# Patient Record
Sex: Female | Born: 1985 | Hispanic: Yes | Marital: Married
Health system: Southern US, Community
[De-identification: ages and names within clinical notes are randomized; demographics above are authoritative.]

## PROBLEM LIST (undated history)

## (undated) DIAGNOSIS — E119 Type 2 diabetes mellitus without complications: Secondary | ICD-10-CM

---

## 2020-02-28 ENCOUNTER — Other Ambulatory Visit: Payer: Self-pay

## 2020-02-28 DIAGNOSIS — E119 Type 2 diabetes mellitus without complications: Secondary | ICD-10-CM | POA: Insufficient documentation

## 2020-02-28 DIAGNOSIS — O2391 Unspecified genitourinary tract infection in pregnancy, first trimester: Secondary | ICD-10-CM | POA: Insufficient documentation

## 2020-02-28 DIAGNOSIS — O4691 Antepartum hemorrhage, unspecified, first trimester: Secondary | ICD-10-CM | POA: Insufficient documentation

## 2020-02-28 DIAGNOSIS — Z3A11 11 weeks gestation of pregnancy: Secondary | ICD-10-CM | POA: Insufficient documentation

## 2020-02-29 ENCOUNTER — Emergency Department (HOSPITAL_COMMUNITY)
Admission: EM | Admit: 2020-02-29 | Discharge: 2020-02-29 | Disposition: A | Discharge status: Home / Self Care | Payer: Self-pay | Attending: Emergency Medicine | Admitting: Emergency Medicine

## 2020-02-29 ENCOUNTER — Emergency Department (HOSPITAL_COMMUNITY): Payer: Self-pay

## 2020-02-29 ENCOUNTER — Encounter (HOSPITAL_COMMUNITY): Payer: Self-pay

## 2020-02-29 DIAGNOSIS — R8271 Bacteriuria: Secondary | ICD-10-CM

## 2020-02-29 DIAGNOSIS — R58 Hemorrhage, not elsewhere classified: Secondary | ICD-10-CM

## 2020-02-29 DIAGNOSIS — O418X10 Other specified disorders of amniotic fluid and membranes, first trimester, not applicable or unspecified: Secondary | ICD-10-CM

## 2020-02-29 DIAGNOSIS — O468X1 Other antepartum hemorrhage, first trimester: Secondary | ICD-10-CM

## 2020-02-29 DIAGNOSIS — O4690 Antepartum hemorrhage, unspecified, unspecified trimester: Secondary | ICD-10-CM

## 2020-02-29 HISTORY — DX: Type 2 diabetes mellitus without complications: E11.9

## 2020-02-29 LAB — URINALYSIS, ROUTINE W REFLEX MICROSCOPIC
Bilirubin Urine: NEGATIVE
Glucose, UA: NEGATIVE mg/dL
Ketones, ur: NEGATIVE mg/dL
Nitrite: NEGATIVE
Protein, ur: NEGATIVE mg/dL
Specific Gravity, Urine: 1.006 (ref 1.005–1.030)
pH: 6 (ref 5.0–8.0)

## 2020-02-29 LAB — CBC
HCT: 38 % (ref 36.0–46.0)
Hemoglobin: 12 g/dL (ref 12.0–15.0)
MCH: 27.8 pg (ref 26.0–34.0)
MCHC: 31.6 g/dL (ref 30.0–36.0)
MCV: 88.2 fL (ref 80.0–100.0)
Platelets: 314 10*3/uL (ref 150–400)
RBC: 4.31 MIL/uL (ref 3.87–5.11)
RDW: 12.9 % (ref 11.5–15.5)
WBC: 11.4 10*3/uL — ABNORMAL HIGH (ref 4.0–10.5)
nRBC: 0 % (ref 0.0–0.2)

## 2020-02-29 LAB — WET PREP, GENITAL
Sperm: NONE SEEN
Trich, Wet Prep: NONE SEEN
WBC, Wet Prep HPF POC: NONE SEEN
Yeast Wet Prep HPF POC: NONE SEEN

## 2020-02-29 LAB — COMPREHENSIVE METABOLIC PANEL
ALT: 18 U/L (ref 0–44)
AST: 22 U/L (ref 15–41)
Albumin: 3.8 g/dL (ref 3.5–5.0)
Alkaline Phosphatase: 41 U/L (ref 38–126)
Anion gap: 10 (ref 5–15)
BUN: 10 mg/dL (ref 6–20)
CO2: 22 mmol/L (ref 22–32)
Calcium: 9.1 mg/dL (ref 8.9–10.3)
Chloride: 105 mmol/L (ref 98–111)
Creatinine, Ser: 0.57 mg/dL (ref 0.44–1.00)
GFR, Estimated: >60 mL/min (ref >60)
Glucose, Bld: 89 mg/dL (ref 70–99)
Potassium: 4.1 mmol/L (ref 3.5–5.1)
Sodium: 137 mmol/L (ref 135–145)
Total Bilirubin: 0.3 mg/dL (ref 0.3–1.2)
Total Protein: 7.4 g/dL (ref 6.5–8.1)

## 2020-02-29 LAB — ABO/RH: ABO/RH(D): O POS

## 2020-02-29 LAB — HCG, QUANTITATIVE, PREGNANCY: hCG, Beta Chain, Quant, S: 96134 m[IU]/mL — ABNORMAL HIGH (ref <5)

## 2020-02-29 MED ORDER — CEPHALEXIN 500 MG PO CAPS: 500.0000 mg | ORAL_CAPSULE | Freq: Four times a day (QID) | ORAL | 0 refills | Status: AC

## 2020-02-29 NOTE — ED Provider Notes (Signed)
[facility] Note   CSN: [id number] Arrival date & time: 02/28/20  2355     History Chief Complaint  Patient presents with  . Vaginal Bleeding    Erica Garrison is a 35 y.o. female with PMH/o DM who presents for evaluation of vaginal bleeding. Patient reports she is currently [redacted] weeks pregnant by LMP (12/14/19) who is G6P5 who presents for evaluation for vaginal bleeding and abdominal cramping that has been ongoing for the last 4 days. Patient reports that she has not sought OB/GYN care for this baby yet but has an appointment scheduled for February.  4 days ago, she started noticing some lower abdominal cramping and then started having some vaginal bleeding.  States that the abdominal cramping is intermittent in nature and states that currently, she does not have any pain.  She states it started off light pink and is since become more dark red/brown in color.  She states that when she does the bathroom and urinates, she will pass blood clots.  She states she has had small spotting at other times but is not grossly vaginal bleeding.  Has not had to use pads throughout the day.  She states since being here, she has only had a few episodes of spotting.  She states that she has not had any prior complications with her pregnancy.  He denies any fevers, nausea/vomiting, difficulty breathing.  The history is provided by the patient. A language interpreter was used.       Past Medical History:  Diagnosis Date  . Diabetes mellitus without complication (HCC)     There are no problems to display for this patient.   The histories are not reviewed yet. Please review them in the "History" navigator section and refresh this SmartLink.   OB History    Gravida  1   Para      Term      Preterm      AB      Living        SAB      IAB      Ectopic      Multiple      Live Births              No family history on file.     Home  Medications Prior to Admission medications   Medication Sig Start Date End Date Taking? Authorizing Provider  cephALEXin (KEFLEX) 500 MG capsule Take 1 capsule (500 mg total) by mouth 4 (four) times daily for 7 days. 02/29/20 03/07/20 Yes Maxwell Caul, PA-C  Prenatal Vit-Fe Fumarate-FA (PNV PRENATAL PLUS MULTIVITAMIN) 27-1 MG TABS Take 1 tablet by mouth daily. 09/24/13  Yes [provider]    Allergies    Patient has no known allergies.  Review of Systems   Review of Systems  Constitutional: Negative for fever.  Respiratory: Negative for cough and shortness of breath.   Cardiovascular: Negative for chest pain.  Gastrointestinal: Negative for abdominal pain, nausea and vomiting.  Genitourinary: Positive for vaginal bleeding. Negative for dysuria and hematuria.  Neurological: Negative for headaches.  All other systems reviewed and are negative.   Physical Exam Updated Vital Signs BP 109/75   Pulse 91   Temp 98.2 F (36.8 C) (Oral)   Resp (!) 26   Ht 5\' 3"  (1.6 m)   Wt 75.3 kg   SpO2 100%   BMI 29.41 kg/m   Physical Exam Vitals and nursing note reviewed. Exam conducted  with a chaperone present.  Constitutional:      Appearance: Normal appearance. She is well-developed and well-nourished.  HENT:     Head: Normocephalic and atraumatic.     Mouth/Throat:     Mouth: Oropharynx is clear and moist and mucous membranes are normal.  Eyes:     General: Lids are normal.     Extraocular Movements: EOM normal.     Conjunctiva/sclera: Conjunctivae normal.     Pupils: Pupils are equal, round, and reactive to light.  Cardiovascular:     Rate and Rhythm: Normal rate and regular rhythm.     Pulses: Normal pulses.     Heart sounds: Normal heart sounds. No murmur heard. No friction rub. No gallop.   Pulmonary:     Effort: Pulmonary effort is normal.     Breath sounds: Normal breath sounds.  Abdominal:     Palpations: Abdomen is soft. Abdomen is not rigid.     Tenderness:  There is no abdominal tenderness. There is no guarding.     Comments: Abdomen is soft, non-distended, non-tender. No rigidity, No guarding. No peritoneal signs.  Genitourinary:    Comments: The exam was performed with a chaperone present Wenda Low, RN). Normal external female genitalia. No lesions, rash, or sores. Difficulty visualizing the cervix. Area of laxity noted to the superior vaginal wall thought to be a small cystocele. No fistula noted. Small amount of dark red/brown bleeding noted in the vaginal vault. No CMT. No adnexal mass or tenderness noted.  Musculoskeletal:        General: Normal range of motion.     Cervical back: Full passive range of motion without pain.  Skin:    General: Skin is warm and dry.     Capillary Refill: Capillary refill takes less than 2 seconds.  Neurological:     Mental Status: She is alert and oriented to person, place, and time.  Psychiatric:        Mood and Affect: Mood and affect normal.        Speech: Speech normal.     ED Results / Procedures / Treatments   Labs (all labs ordered are listed, but only abnormal results are displayed) Labs Reviewed  WET PREP, GENITAL - Abnormal; Notable for the following components:      Result Value   Clue Cells Wet Prep HPF POC PRESENT (*)    All other components within normal limits  HCG, QUANTITATIVE, PREGNANCY - Abnormal; Notable for the following components:   hCG, Beta Chain, Quant, S 96,134 (*)    All other components within normal limits  CBC - Abnormal; Notable for the following components:   WBC 11.4 (*)    All other components within normal limits  URINALYSIS, ROUTINE W REFLEX MICROSCOPIC - Abnormal; Notable for the following components:   Color, Urine STRAW (*)    Hgb urine dipstick MODERATE (*)    Leukocytes,Ua TRACE (*)    Bacteria, UA RARE (*)    All other components within normal limits  COMPREHENSIVE METABOLIC PANEL  ABO/RH  GC/CHLAMYDIA PROBE AMP ([place]) NOT AT [facility]     EKG None  Radiology US OB Comp Less 14 Wks  Result Date: 02/29/2020 CLINICAL DATA:  Vaginal bleeding.  Pregnant.  Unknown LMP. EXAM: OBSTETRIC <14 WK ULTRASOUND TECHNIQUE: Transabdominal ultrasound was performed for evaluation of the gestation as well as the maternal uterus and adnexal regions. COMPARISON:  None. FINDINGS: Intrauterine gestational sac: Present, single Yolk sac:  Not visualized Embryo:  Present, single Cardiac Activity: Present, regular Heart Rate: 156 bpm MSD: Appropriate given fetal size CRL:   52 mm   11 w 6 d                  [place] EDC: 6 09/13/2020 Subchorionic hemorrhage: A small subchorionic hemorrhage is identified comprising less than 25% of the chorionic surface. Maternal uterus/adnexae: The uterus is anteverted. No intrauterine masses are seen. The cervix is not optimally visualized, but appears closed. No free intraperitoneal fluid is identified. The maternal right ovary is unremarkable. The left ovary is not visualized. IMPRESSION: Single living intrauterine gestation with an estimated gestational age of [redacted] weeks, 6 days. Nonvisualization of the yolk sac at this age is a normal finding. Small subchorionic hemorrhage. Electronically Signed   By: Helyn Numbers MD   On: 02/29/2020 05:03    Procedures Procedures   Medications Ordered in ED Medications - No data to display  ED Course  I have reviewed the triage vital signs and the nursing notes.  Pertinent labs & imaging results that were available during my care of the patient were reviewed by me and considered in my medical decision making (see chart for details).    MDM Rules/Calculators/A&P                          35 year old female who is G6, P5 who is currently [redacted] weeks pregnant based on last menstrual cycle presents for evaluation of vaginal bleeding.  She reports she had some abdominal cramping when symptoms started.  Currently denies any abdominal pain.  No fevers.  She has not sought OB/GYN evaluation  for this pregnancy.  On initial arrival, she is afebrile, nontoxic-appearing.  Vital signs are stable.  On exam, no abdominal tenderness.  Labs, ultrasound ordered at triage.  We will plan for pelvic.  Pelvic exam as documented above.  She had small amount of brown/dark red blood noted in the vaginal vault.  No active hemorrhage.  No white discharge noted.  Difficulty visualizing cervix.  She did have some laxity noted in the superior vaginal wall thought to be a small cystocele.  No open fistula noted.  No tenderness, CMT.  CBC shows slight leukocytosis of 11.4.  CMP shows normal BUN and creatinine.  Beta quant is 96,134.  Ultrasound shows single living intrauterine gestation with an estimated gestational age 102 weeks, 6 days.  Nonvisualization of the yolk sac at this time is normal finding.  Wet prep with clue cells.  No white discharge noted in vaginal vault.  She has a small subchorionic hemorrhage.  Urine shows trace leukocytes, bacteria.  Given that she is pregnant, we will plan to treat.  Using the interpreter, I discussed the results with patient.  At this time, suspect that the subchorionic hemorrhage is contributing to her vaginal bleeding.  At this time, she is a heavy abdominal tenderness, is well-appearing and hemodynamically stable.  Encourage patient that she needs to call her OB/GYN arrange for follow-up in the next few days for reevaluation.  Additionally, we will give her on-call OB/GYN.  We will plan to treat her asymptomatic bacteriuria.  Patient with no known drug allergies. At this time, patient exhibits no emergent life-threatening condition that require further evaluation in ED. Patient had ample opportunity for questions and discussion. All patient's questions were answered with full understanding. Strict return precautions discussed. Patient expresses understanding and agreement to plan.  Portions of this note were generated  with Scientist, clinical (histocompatibility and immunogenetics)Dragon dictation software. Dictation errors may occur  despite best attempts at proofreading.  Final Clinical Impression(s) / ED Diagnoses Final diagnoses:  Bleeding  Vaginal bleeding in pregnancy  Subchorionic hemorrhage of placenta in first trimester, single or unspecified fetus  Asymptomatic bacteriuria    Rx / DC Orders ED Discharge Orders         Ordered    cephALEXin (KEFLEX) 500 MG capsule  4 times daily        02/29/20 1853           Rosana HoesLayden, Chiyo Fay A, PA-C 02/29/20 2004    Linwood DibblesKnapp, Jon, MD 03/04/20 [phone]

## 2020-02-29 NOTE — ED Notes (Signed)
Pt left prior to dc vitals

## 2020-02-29 NOTE — ED Triage Notes (Signed)
Patient arrived stating she is [redacted] weeks pregnant and began passing blood clots on Saturday after being seen for leaking pink vaginal fluid. Reports pads are dry but only sees the clots when wiping after urination.

## 2020-02-29 NOTE — Discharge Instructions (Addendum)
As we discussed, your ultrasound showed that the baby was 11 weeks, 6 days.  It also showed a small subchorionic hemorrhage which is likely causing the bleeding.  You will need to follow-up with your OB/GYN in the next 2 to 3 days.  I have also provided our on-call OB/GYN that you can follow-up with.  You will need a repeat exam.  Should refrain from having any intercourse until the bleeding stops.  Additionally, your urine today shows some bacteria.  We will plan to treat this with antibiotics.   Como comentamos, su ecografa mostr que el beb tena 11 semanas y [address]. Tambin mostr una pequea hemorragia subcorinica que probablemente est causando el sangrado. Deber realizar un seguimiento con su obstetra/gineclogo en los prximos 2 a 3 das. Tambin he proporcionado nuestro obstetra/gineclogo de guardia con el que puede hacer un seguimiento. Necesitar un examen repetido.  Debe abstenerse de [employer] sexuales hasta que cese el sangrado.  Adems, su orina de hoy muestra algunas bacterias. Planearemos tratar esto con antibiticos.  Regrese al departamento de emergencias por cualquier empeoramiento del sangrado, dolor abdominal, fiebre o cualquier otro empeoramiento de los sntomas relacionados.  Return to emergency department for any worsening bleeding, abdominal pain, fevers or any other worsening concerning symptoms.

## 2020-03-01 LAB — GC/CHLAMYDIA PROBE AMP (~~LOC~~) NOT AT ARMC
Chlamydia: NEGATIVE
Comment: NEGATIVE
Comment: NORMAL
Neisseria Gonorrhea: NEGATIVE

## 2021-09-21 IMAGING — US US OB COMP LESS 14 WK
1 series · 14 of 28 positions shown · non-contrast
Comparison: None.

CLINICAL DATA: Vaginal bleeding.  Pregnant.  Unknown LMP.

EXAM:
OBSTETRIC <14 WK ULTRASOUND
TECHNIQUE: Transabdominal ultrasound was performed for evaluation of the
gestation as well as the maternal uterus and adnexal regions.

[Series 1: us ob comp less 14 wk · 40 acquisitions, 14 frames shown]
[im 2/40]
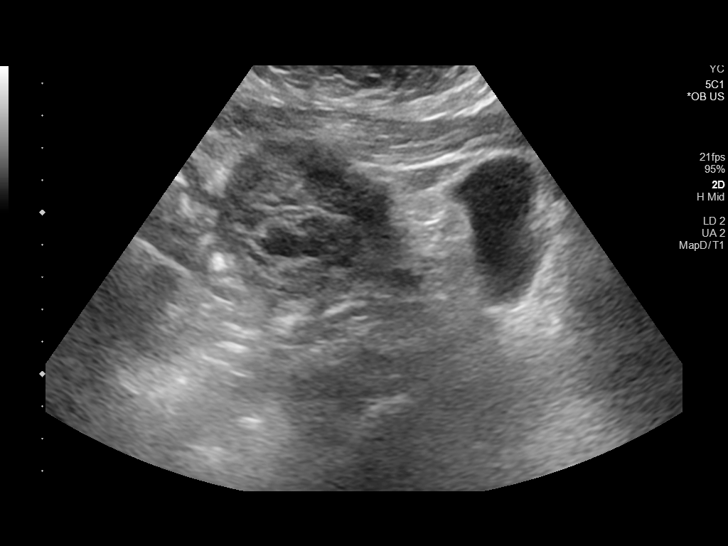
[im 5/40]
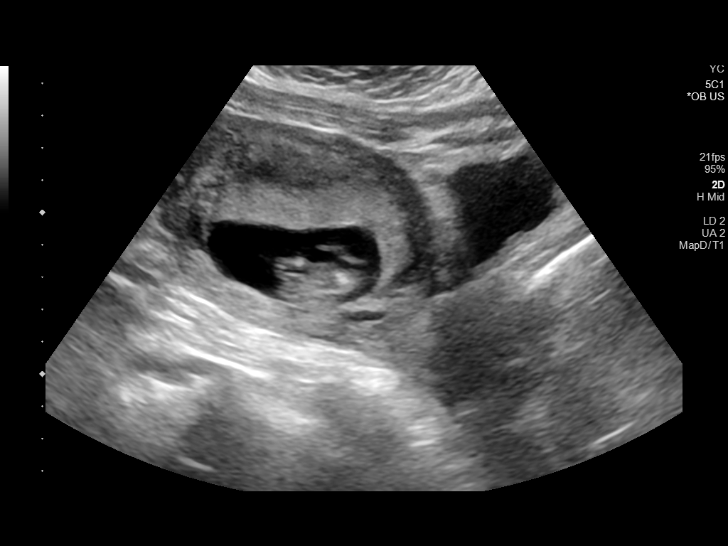
[im 8/40]
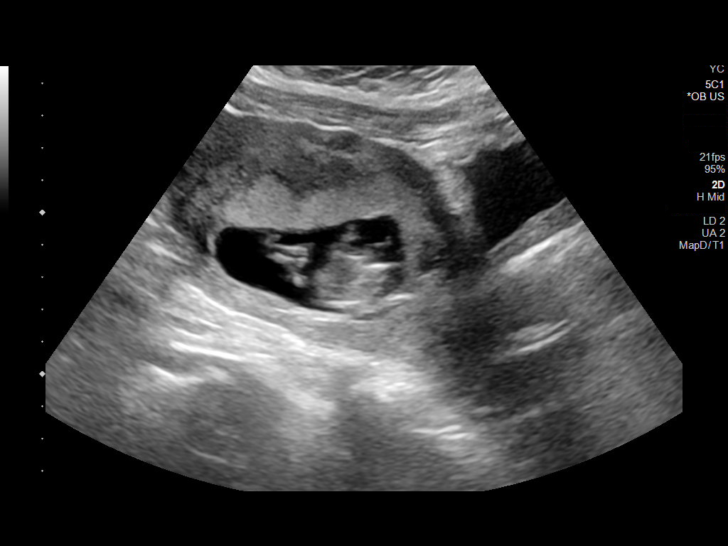
[im 11/40]
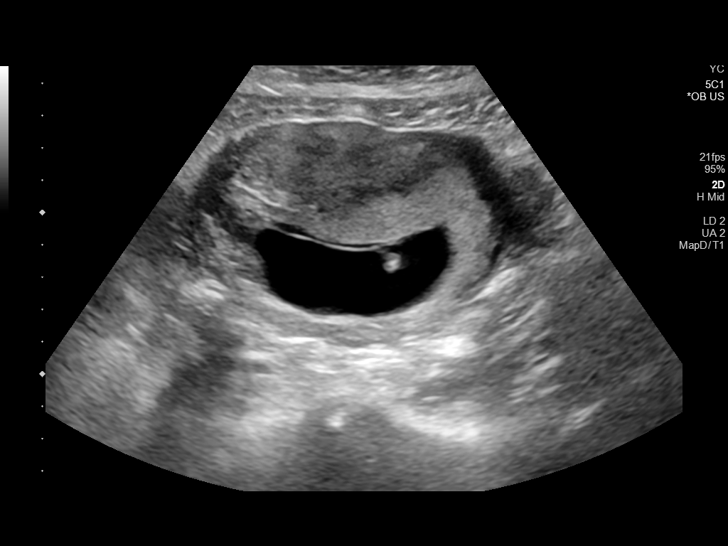
[im 14/40]
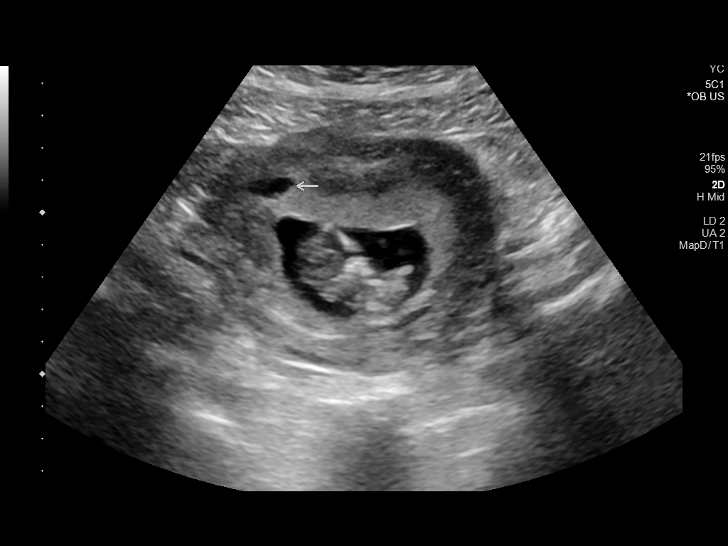
[im 16/40]
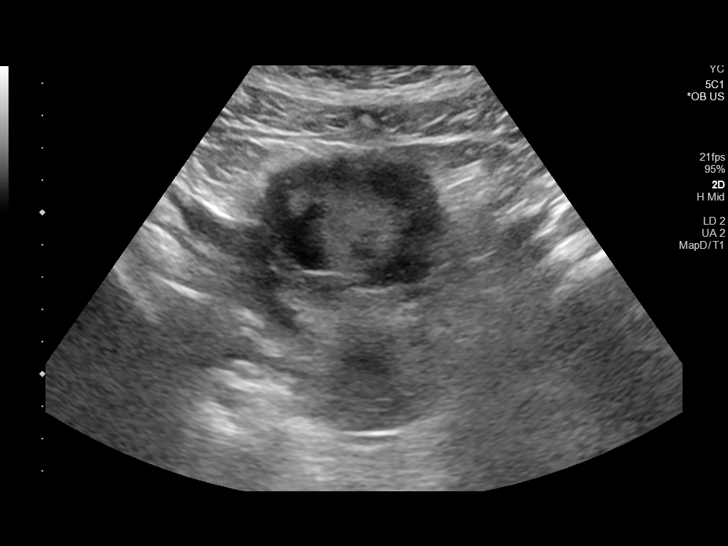
[im 19/40]
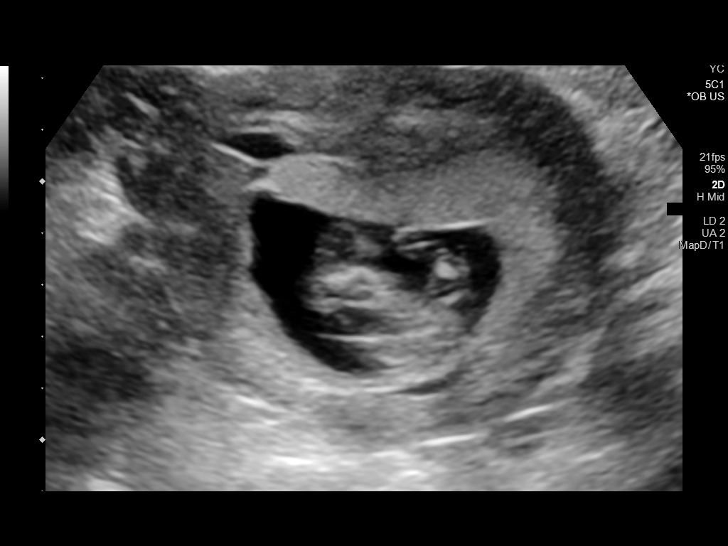
[im 22/40]
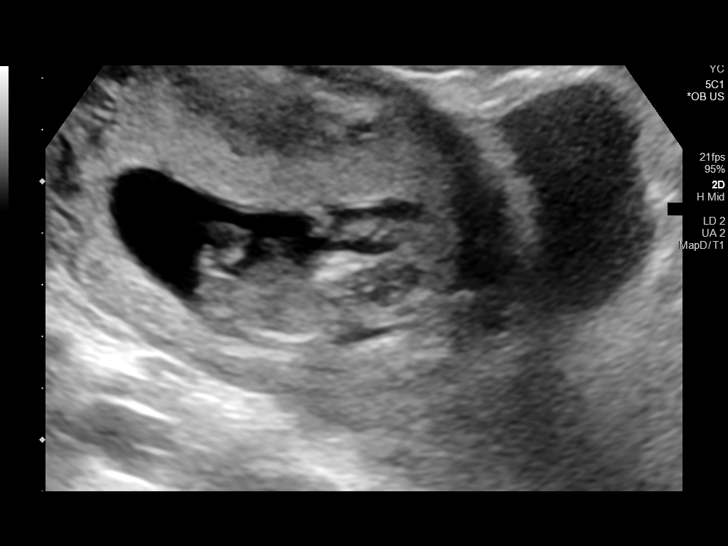
[im 25/40]
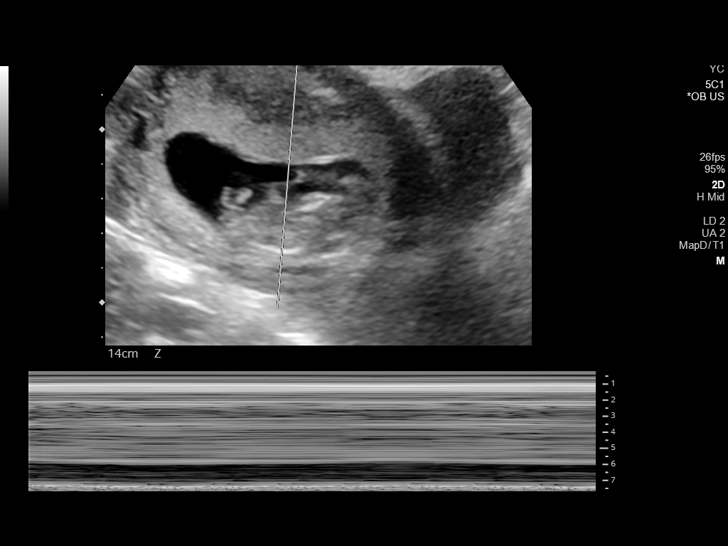
[im 28/40]
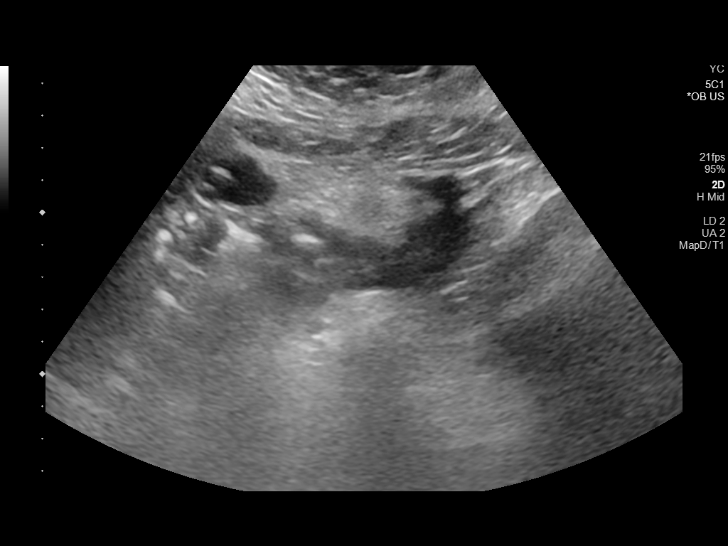
[im 31/40]
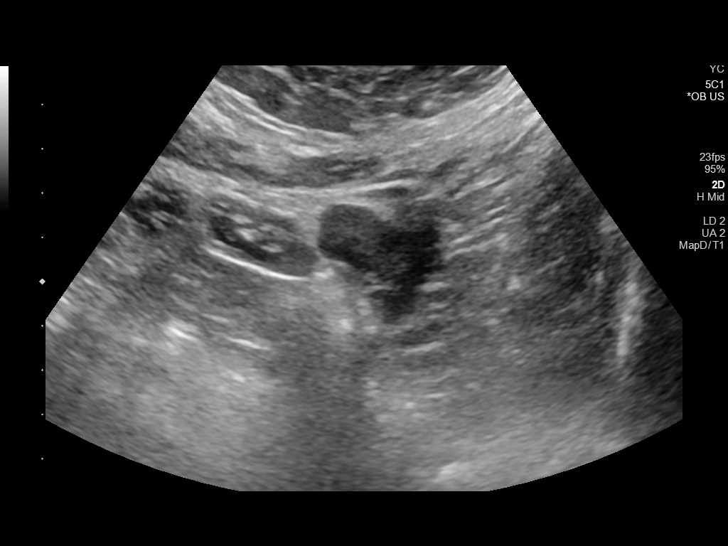
[im 34/40]
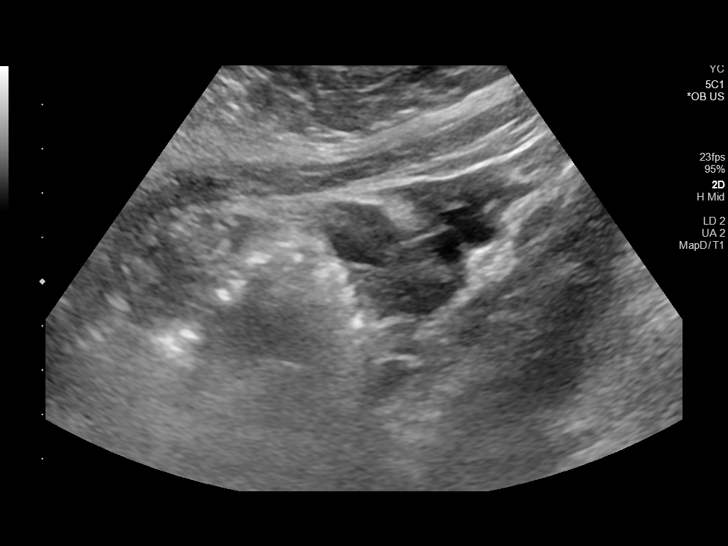
[im 37/40]
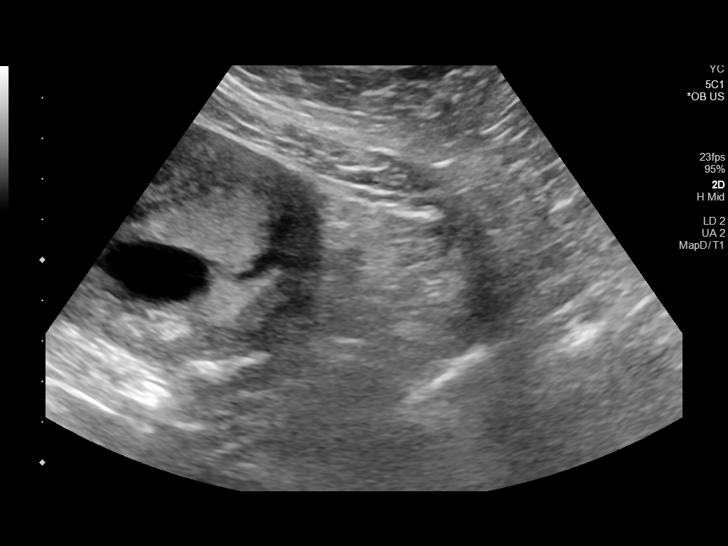
[im 40/40]
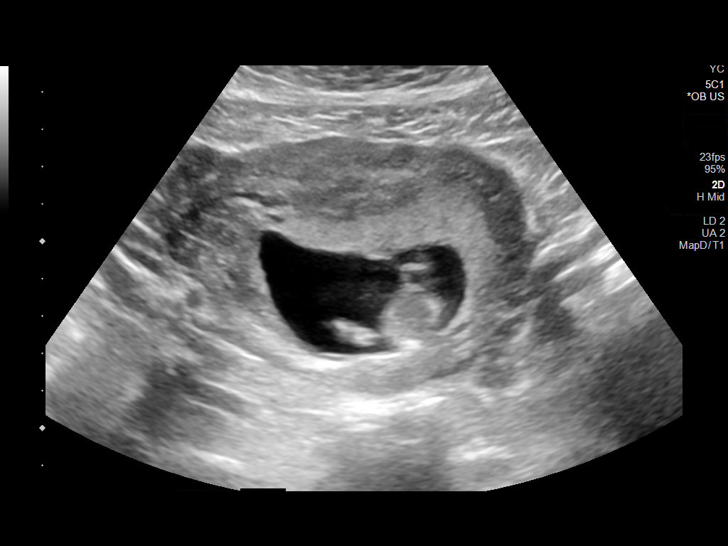

[14 of 28 positions shown; findings below may reference images not displayed]

FINDINGS: Intrauterine gestational sac: Present, single

Yolk sac:  Not visualized

Embryo:  Present, single

Cardiac Activity: Present, regular

Heart Rate: 156 bpm

MSD: Appropriate given fetal size

CRL:   52 mm   11 w 6 d                  [place] EDC: 6 09/13/2020

Subchorionic hemorrhage: A small subchorionic hemorrhage is
identified comprising less than 25% of the chorionic surface.

Maternal uterus/adnexae: The uterus is anteverted. No intrauterine
masses are seen. The cervix is not optimally visualized, but appears
closed. No free intraperitoneal fluid is identified. The maternal
right ovary is unremarkable. The left ovary is not visualized.
IMPRESSION: Single living intrauterine gestation with an estimated gestational
age of 11 weeks, 6 days. Nonvisualization of the yolk sac at this
age is a normal finding.

Small subchorionic hemorrhage.
